# Patient Record
Sex: Female | Born: 1960 | Race: White | Hispanic: No | Smoking: Never smoker
Health system: Southern US, Community
[De-identification: ages and names within clinical notes are randomized; demographics above are authoritative.]

## PROBLEM LIST (undated history)

## (undated) DIAGNOSIS — Z789 Other specified health status: Secondary | ICD-10-CM

## (undated) DIAGNOSIS — I10 Essential (primary) hypertension: Secondary | ICD-10-CM

---

## 2020-04-07 ENCOUNTER — Emergency Department (HOSPITAL_COMMUNITY): Payer: 59

## 2020-04-07 ENCOUNTER — Encounter (HOSPITAL_COMMUNITY): Payer: Self-pay

## 2020-04-07 ENCOUNTER — Emergency Department (HOSPITAL_COMMUNITY)
Admission: EM | Admit: 2020-04-07 | Discharge: 2020-04-08 | Disposition: A | Discharge status: Home / Self Care | Payer: 59 | Attending: Emergency Medicine | Admitting: Emergency Medicine

## 2020-04-07 DIAGNOSIS — Z20822 Contact with and (suspected) exposure to covid-19: Secondary | ICD-10-CM | POA: Insufficient documentation

## 2020-04-07 DIAGNOSIS — I10 Essential (primary) hypertension: Secondary | ICD-10-CM | POA: Diagnosis not present

## 2020-04-07 DIAGNOSIS — R070 Pain in throat: Secondary | ICD-10-CM | POA: Insufficient documentation

## 2020-04-07 DIAGNOSIS — R0981 Nasal congestion: Secondary | ICD-10-CM | POA: Insufficient documentation

## 2020-04-07 DIAGNOSIS — R079 Chest pain, unspecified: Secondary | ICD-10-CM

## 2020-04-07 DIAGNOSIS — R519 Headache, unspecified: Secondary | ICD-10-CM | POA: Diagnosis present

## 2020-04-07 HISTORY — DX: Other specified health status: Z78.9

## 2020-04-07 LAB — CBC WITH DIFFERENTIAL/PLATELET
Abs Immature Granulocytes: 0.02 10*3/uL (ref 0.00–0.07)
Basophils Absolute: 0.1 10*3/uL (ref 0.0–0.1)
Basophils Relative: 1 %
Eosinophils Absolute: 0.1 10*3/uL (ref 0.0–0.5)
Eosinophils Relative: 1 %
HCT: 43.6 % (ref 36.0–46.0)
Hemoglobin: 14 g/dL (ref 12.0–15.0)
Immature Granulocytes: 0 %
Lymphocytes Relative: 26 %
Lymphs Abs: 2.6 10*3/uL (ref 0.7–4.0)
MCH: 28 pg (ref 26.0–34.0)
MCHC: 32.1 g/dL (ref 30.0–36.0)
MCV: 87.2 fL (ref 80.0–100.0)
Monocytes Absolute: 1.1 10*3/uL — ABNORMAL HIGH (ref 0.1–1.0)
Monocytes Relative: 11 %
Neutro Abs: 6.2 10*3/uL (ref 1.7–7.7)
Neutrophils Relative %: 61 %
Platelets: 297 10*3/uL (ref 150–400)
RBC: 5 MIL/uL (ref 3.87–5.11)
RDW: 12.4 % (ref 11.5–15.5)
WBC: 10.1 10*3/uL (ref 4.0–10.5)
nRBC: 0 % (ref 0.0–0.2)

## 2020-04-07 LAB — COMPREHENSIVE METABOLIC PANEL
ALT: 22 U/L (ref 0–44)
AST: 25 U/L (ref 15–41)
Albumin: 3.9 g/dL (ref 3.5–5.0)
Alkaline Phosphatase: 55 U/L (ref 38–126)
Anion gap: 12 (ref 5–15)
BUN: 16 mg/dL (ref 6–20)
CO2: 18 mmol/L — ABNORMAL LOW (ref 22–32)
Calcium: 9.1 mg/dL (ref 8.9–10.3)
Chloride: 109 mmol/L (ref 98–111)
Creatinine, Ser: 0.82 mg/dL (ref 0.44–1.00)
GFR, Estimated: >60 mL/min (ref >60)
Glucose, Bld: 110 mg/dL — ABNORMAL HIGH (ref 70–99)
Potassium: 3.9 mmol/L (ref 3.5–5.1)
Sodium: 139 mmol/L (ref 135–145)
Total Bilirubin: 1 mg/dL (ref 0.3–1.2)
Total Protein: 7.2 g/dL (ref 6.5–8.1)

## 2020-04-07 LAB — URINALYSIS, ROUTINE W REFLEX MICROSCOPIC
Bacteria, UA: NONE SEEN
Bilirubin Urine: NEGATIVE
Glucose, UA: NEGATIVE mg/dL
Ketones, ur: NEGATIVE mg/dL
Nitrite: NEGATIVE
Protein, ur: NEGATIVE mg/dL
Specific Gravity, Urine: 1.011 (ref 1.005–1.030)
pH: 5 (ref 5.0–8.0)

## 2020-04-07 LAB — RESP PANEL BY RT-PCR (FLU A&B, COVID) ARPGX2
Influenza A by PCR: NEGATIVE
Influenza B by PCR: NEGATIVE
SARS Coronavirus 2 by RT PCR: NEGATIVE

## 2020-04-07 LAB — LIPASE, BLOOD: Lipase: 38 U/L (ref 11–51)

## 2020-04-07 LAB — TROPONIN I (HIGH SENSITIVITY): Troponin I (High Sensitivity): 2 ng/L (ref <18)

## 2020-04-07 NOTE — ED Triage Notes (Signed)
EMS arrival from home co headache x1 week and new onset starting 4 days ago chest discomfort 10/10. Per EMS pt reported palpations, blurred vision, and lightheadedness, BP 260/140 denies n/v. EMS administered 324mg  aspirin and 2 4mg  Nitro with relief 4/10.

## 2020-04-07 NOTE — ED Notes (Signed)
Patient transported to CT 

## 2020-04-07 NOTE — ED Provider Notes (Signed)
[facility] EMERGENCY DEPARTMENT Provider Note   CSN: [id number] Arrival date & time: 04/07/20  2018     History Chief Complaint  Patient presents with  . Headache  . Chest Pain    Jamie Hart is a 59 y.o. female who presents today for evaluation of headache and chest pain for 3 days.  She reports that 3 days ago she developed pain in the center left side of her chest.  It has not radiated or moved.  She reports she has felt presyncopal, notices mostly with position changes.  She denies any history of high blood pressure, however does not have a primary care doctor.  She was given 324 mg of aspirin and 2 doses of nitro with EMS with relief of her symptoms and improvement of her chest pain down from a 10 out of 10 to a 4 out of 10.    Chart review shows she was seen at [facility] urgent care on 03/26/2020, and at that point her blood pressure was 144/86.  She reports that she had a runny nose and sore throat when the symptoms started however that is now resolved.  She denies vomiting diarrhea.  No abdominal pain or back pain.  She reports she gets paresthesias in her bilateral hands when she sleeps however this is unchanged from her baseline.  She is not vaccinated against Covid.  She has no known Covid exposures.  HPI     Past Medical History:  Diagnosis Date  . Known health problems: none     There are no problems to display for this patient.   Past Surgical History:  Procedure Laterality Date  . CESAREAN SECTION       OB History   No obstetric history on file.     History reviewed. No pertinent family history.  Social History   Tobacco Use  . Smoking status: Never Smoker  . Smokeless tobacco: Never Used  Substance Use Topics  . Alcohol use: Yes    Comment: social   . Drug use: Never    Home Medications Prior to Admission medications   Not on File    Allergies    Penicillins  Review of Systems   Review of Systems  Constitutional:  Positive for chills. Negative for fatigue.  HENT: Negative for congestion.   Respiratory: Positive for cough.   Cardiovascular: Positive for chest pain. Negative for palpitations and leg swelling.  Gastrointestinal: Positive for abdominal pain. Negative for constipation, diarrhea and vomiting.  Genitourinary: Negative for dysuria.  Musculoskeletal: Negative for back pain and neck pain.  Skin: Negative for color change and rash.  Neurological: Positive for light-headedness and headaches. Negative for syncope, speech difficulty and weakness.  All other systems reviewed and are negative.   Physical Exam Updated Vital Signs BP (!) 146/78 (BP Location: Right Arm)   Pulse 90   Temp 98.8 F (37.1 C) (Oral)   Resp 18   Ht 5\' 1"  (1.549 m)   Wt 79.8 kg   SpO2 95%   BMI 33.25 kg/m   Physical Exam Vitals and nursing note reviewed.  Constitutional:      General: She is not in acute distress.    Appearance: She is well-developed. She is not diaphoretic.  HENT:     Head: Normocephalic and atraumatic.     Mouth/Throat:     Mouth: Mucous membranes are moist.  Eyes:     General: No scleral icterus.       Right eye: No  discharge.        Left eye: No discharge.     Conjunctiva/sclera: Conjunctivae normal.  Cardiovascular:     Rate and Rhythm: Normal rate and regular rhythm.     Heart sounds: Normal heart sounds. No murmur heard.   Pulmonary:     Effort: Pulmonary effort is normal. No respiratory distress.     Breath sounds: Normal breath sounds. No stridor.  Abdominal:     General: There is no distension.     Tenderness: There is no abdominal tenderness.  Musculoskeletal:        General: No deformity.     Cervical back: Normal range of motion.  Skin:    General: Skin is warm and dry.  Neurological:     Mental Status: She is alert.     GCS: GCS eye subscore is 4. GCS verbal subscore is 5. GCS motor subscore is 6.     Cranial Nerves: No cranial nerve deficit.     Motor: No  weakness or abnormal muscle tone.  Psychiatric:        Mood and Affect: Mood normal.        Behavior: Behavior normal.     ED Results / Procedures / Treatments   Labs (all labs ordered are listed, but only abnormal results are displayed) Labs Reviewed  CBC WITH DIFFERENTIAL/PLATELET - Abnormal; Notable for the following components:      Result Value   Monocytes Absolute 1.1 (*)    All other components within normal limits  COMPREHENSIVE METABOLIC PANEL - Abnormal; Notable for the following components:   CO2 18 (*)    Glucose, Bld 110 (*)    All other components within normal limits  URINALYSIS, ROUTINE W REFLEX MICROSCOPIC - Abnormal; Notable for the following components:   Hgb urine dipstick MODERATE (*)    Leukocytes,Ua MODERATE (*)    All other components within normal limits  RESP PANEL BY RT-PCR (FLU A&B, COVID) ARPGX2  LIPASE, BLOOD  TROPONIN I (HIGH SENSITIVITY)  TROPONIN I (HIGH SENSITIVITY)    EKG EKG Interpretation  Date/Time:  Tuesday April 07 2020 21:51:37 EST Ventricular Rate:  83 PR Interval:  134 QRS Duration: 82 QT Interval:  358 QTC Calculation: 420 R Axis:   61 Text Interpretation: Normal sinus rhythm Normal ECG No previous ECGs available Confirmed by Richardean Canal [phone]) on 04/07/2020 10:23:15 PM   Radiology DG Chest 2 View  Result Date: 04/07/2020 CLINICAL DATA:  Chest pain EXAM: CHEST - 2 VIEW COMPARISON:  None. FINDINGS: The heart size and mediastinal contours are within normal limits. Both lungs are clear. The visualized skeletal structures are unremarkable. IMPRESSION: No active cardiopulmonary disease. Electronically Signed   By: Katherine Mantle M.D.   On: 04/07/2020 21:07   CT Head Wo Contrast  Result Date: 04/07/2020 CLINICAL DATA:  59 year old female with headache, hypertension. EXAM: CT HEAD WITHOUT CONTRAST TECHNIQUE: Contiguous axial images were obtained from the base of the skull through the vertex without intravenous contrast.  COMPARISON:  None. FINDINGS: Brain: No midline shift, ventriculomegaly, mass effect, evidence of mass lesion, intracranial hemorrhage or evidence of cortically based acute infarction. Gray-white matter differentiation is within normal limits throughout the brain. Vascular: No suspicious intracranial vascular hyperdensity. Skull: Negative. Sinuses/Orbits: Visualized paranasal sinuses and mastoids are clear. Other: Visualized orbits and scalp soft tissues are within normal limits. IMPRESSION: Normal noncontrast Head CT. Electronically Signed   By: Odessa Fleming M.D.   On: 04/07/2020 21:53    Procedures Procedures (  including critical care time)  Medications Ordered in ED Medications - No data to display  ED Course  I have reviewed the triage vital signs and the nursing notes.  Pertinent labs & imaging results that were available during my care of the patient were reviewed by me and considered in my medical decision making (see chart for details).    MDM Rules/Calculators/A&P                         Patient is a 59 year old woman who presents today for evaluation of chest pain and headache.  She started feeling lightheaded.  With EMS her blood pressure was reportedly in the 260s systolic, here her blood pressure was 151/74 on arrival.  Covid test is negative.  Given her headache head CT is obtained without acute abnormalities.  EMS did give her 2 doses of nitroglycerin which improved her symptoms and may have contributed to her lower blood pressure.  CBC does not show significant anemia.  UA shows moderate blood and leukocytes however no bacteria seen.  She is neurovascularly intact on my exam.  Plan to continue monitor blood pressure and obtain 2 troponins.  If these are negative anticipate patient will most likely be able to discharge home.  At shift change care was transferred to Sharilyn Sites PA-C who will follow pending studies, re-evaulate and determine disposition.    Note: Portions of this  report may have been transcribed using voice recognition software. Every effort was made to ensure accuracy; however, inadvertent computerized transcription errors may be present  Final Clinical Impression(s) / ED Diagnoses Final diagnoses:  Hypertension, unspecified type  Chest pain, unspecified type  Acute nonintractable headache, unspecified headache type    Rx / DC Orders ED Discharge Orders    None       Norman Clay 04/07/20 [id number]    Charlynne Pander, MD 04/08/20 1556

## 2020-04-07 NOTE — ED Notes (Addendum)
Patient is resting comfortably. Pt is back from CT.

## 2020-04-07 NOTE — Discharge Instructions (Addendum)
Work-up today was all reassuring. Follow-up with your doctor about your blood pressure.

## 2020-04-08 LAB — TROPONIN I (HIGH SENSITIVITY): Troponin I (High Sensitivity): 6 ng/L (ref <18)

## 2020-04-08 NOTE — ED Provider Notes (Signed)
   Delta trop is negative.  BP remains largely unchanged from time of arrival, 151/83 on last check.  Stable for discharge.  Has had some recent stress due to abrupt death in family so elevated BP may be transient, but will need close FU with PCP. May return here for any new/acute changes.   Garlon Hatchet, PA-C 04/08/20 [id number]    Gilda Crease, MD 04/08/20 [phone]

## 2021-10-03 IMAGING — CT CT HEAD W/O CM
3 series · 16 of 47 positions shown, 19 images · non-contrast
Comparison: None.

CLINICAL DATA: 59-year-old female with headache, hypertension.

EXAM:
CT HEAD WITHOUT CONTRAST
TECHNIQUE: Contiguous axial images were obtained from the base of the skull
through the vertex without intravenous contrast.

[Series 3: head 5.0 h30s · axial · 0.41mm/px · z∈[-140,+0]mm · 10 of 34 slices shown, 13 images]
[im 3/34  brain]
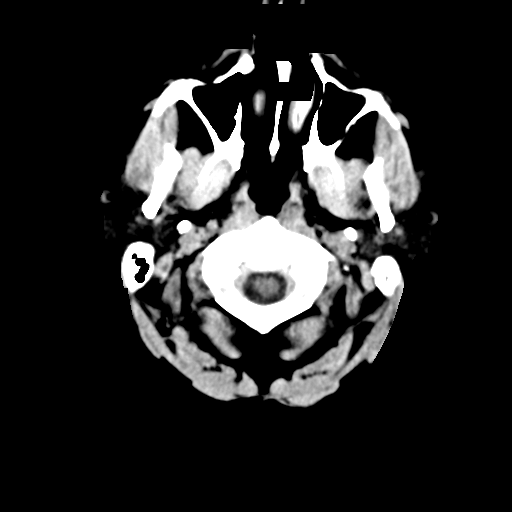
[im 3/34  bone]
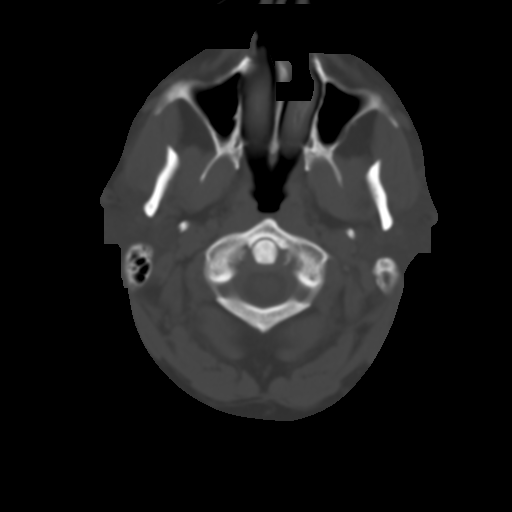
[im 6/34  brain]
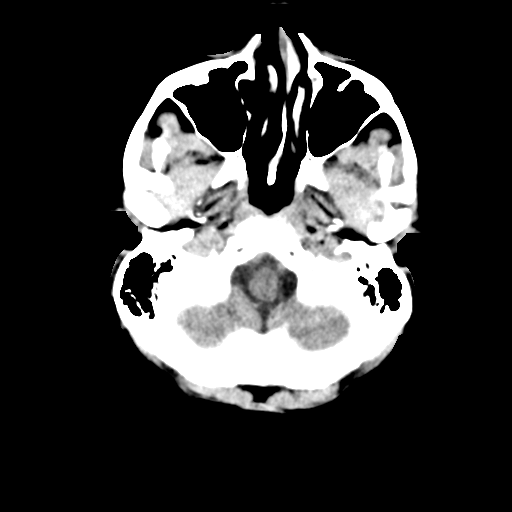
[im 10/34  brain]
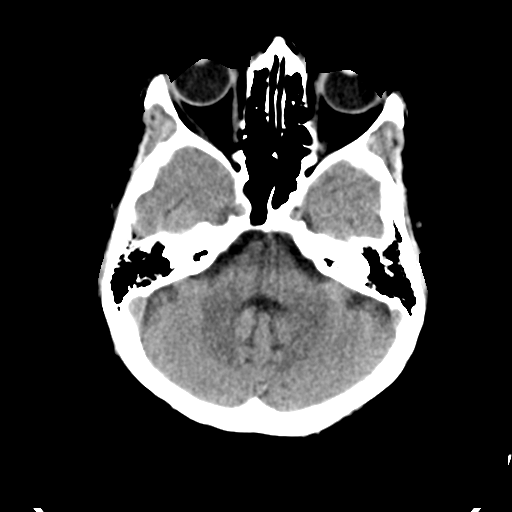
[im 12/34  brain]
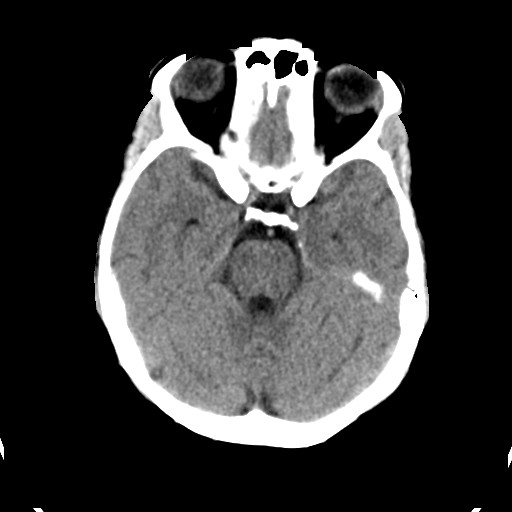
[im 15/34  brain]
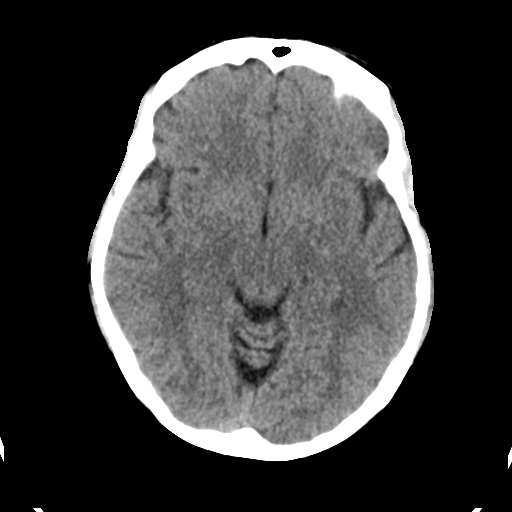
[im 15/34  bone]
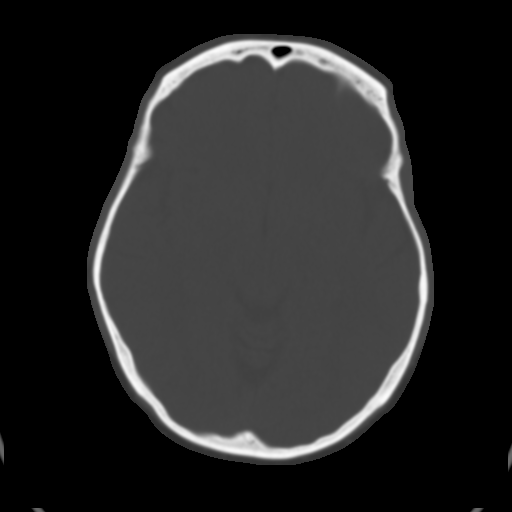
[im 19/34  brain]
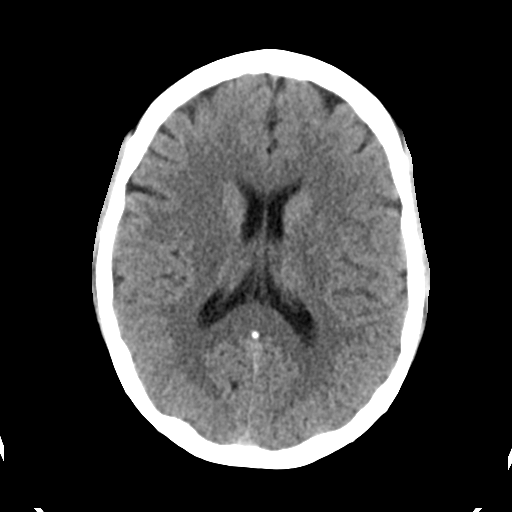
[im 22/34  brain]
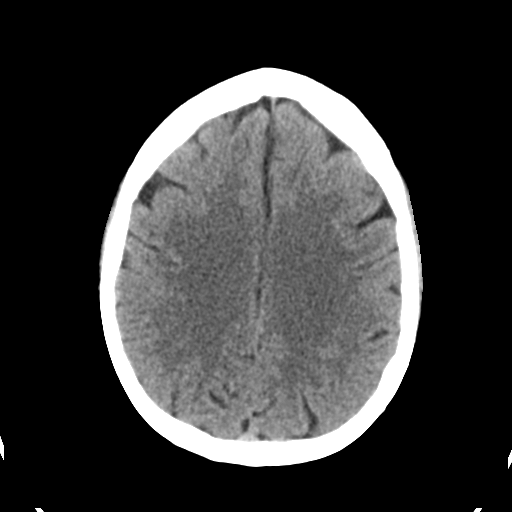
[im 26/34  brain]
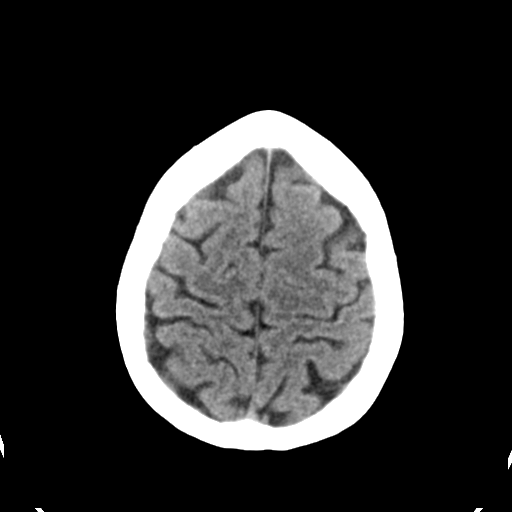
[im 28/34  brain]
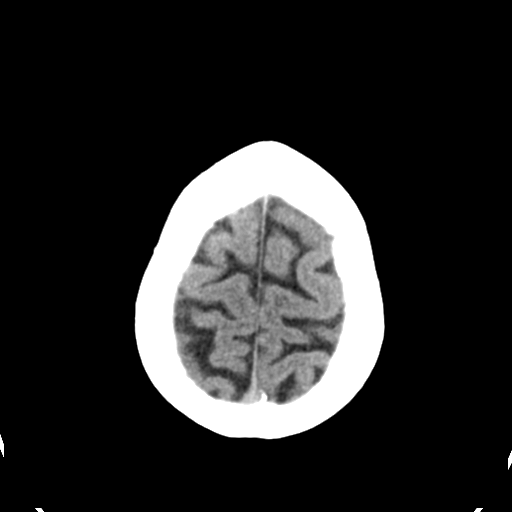
[im 28/34  bone]
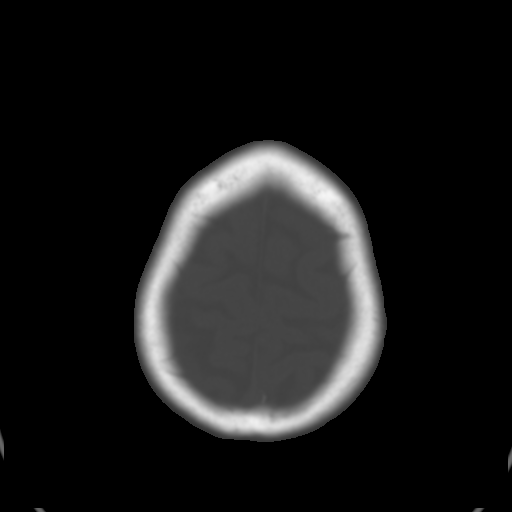
[im 31/34  brain]
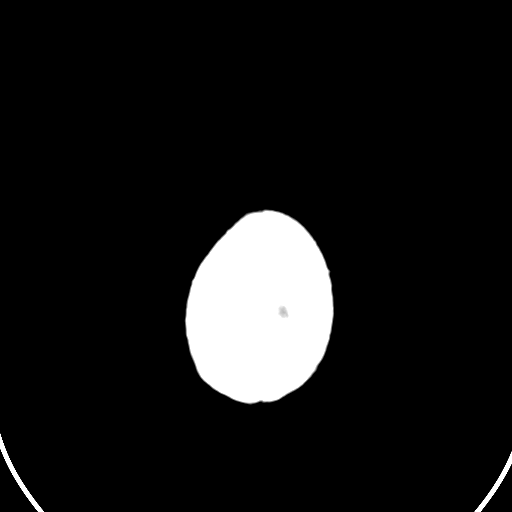

[Series 5: head 3.0 mpr cor · coronal · 0.33mm/px · 3 of 67 slices shown]
[im 23/67  brain]
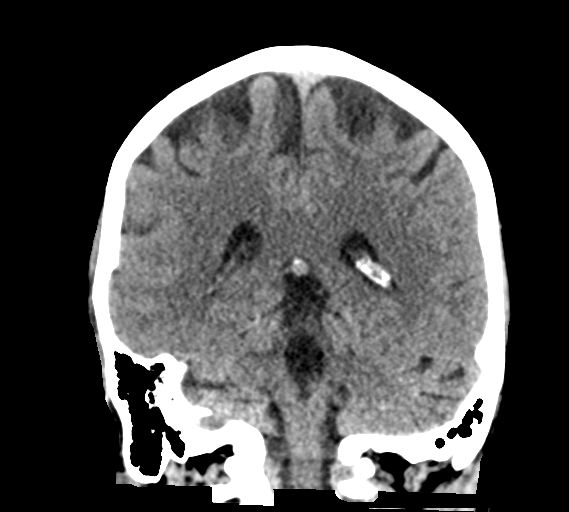
[im 30/67  brain]
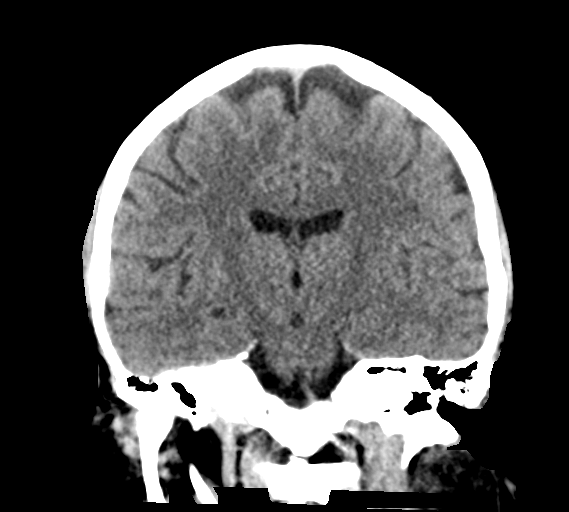
[im 37/67  brain]
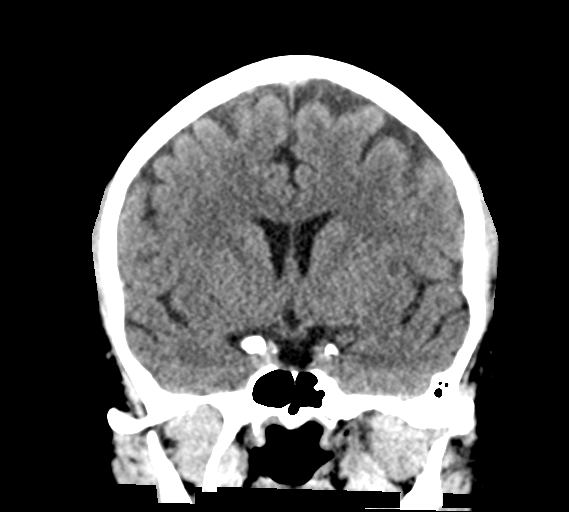

[Series 6: head 3.0 mpr sag · sagittal · 0.33mm/px · 3 of 65 slices shown]
[im 22/65  brain]
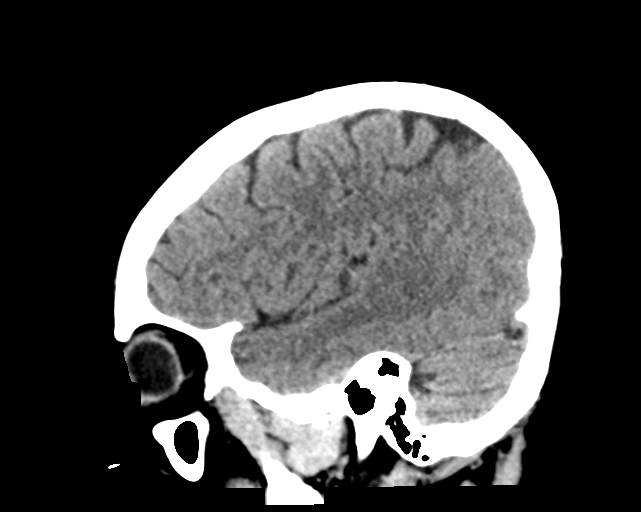
[im 33/65  brain]
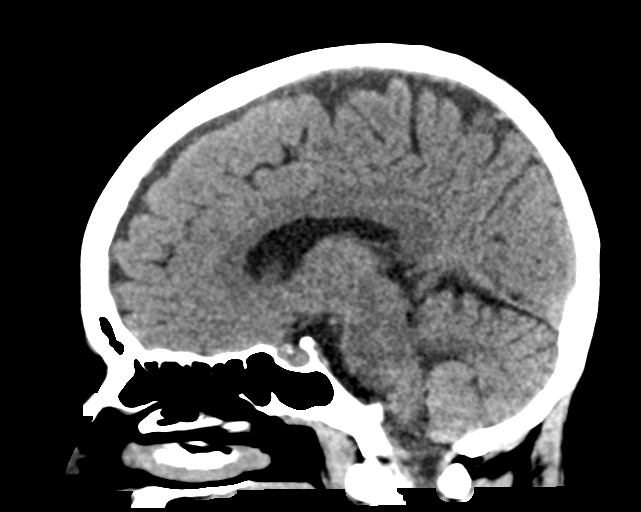
[im 43/65  brain]
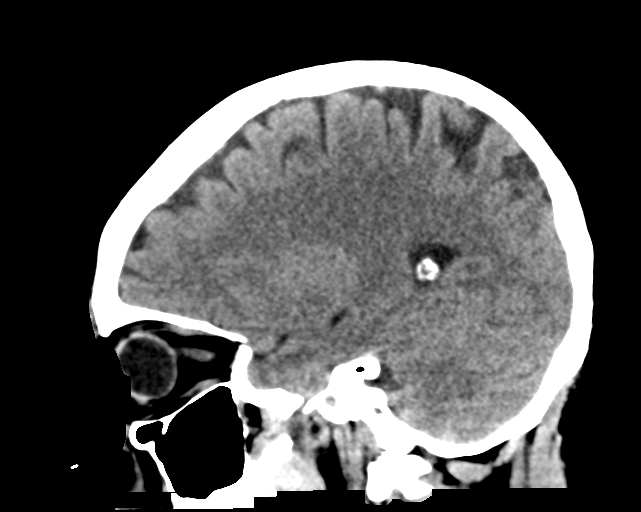

[16 of 47 positions shown; findings below may reference images not displayed]

FINDINGS: Brain: No midline shift, ventriculomegaly, mass effect, evidence of
mass lesion, intracranial hemorrhage or evidence of cortically based
acute infarction. Gray-white matter differentiation is within normal
limits throughout the brain.

Vascular: No suspicious intracranial vascular hyperdensity.

Skull: Negative.

Sinuses/Orbits: Visualized paranasal sinuses and mastoids are clear.

Other: Visualized orbits and scalp soft tissues are within normal
limits.
IMPRESSION: Normal noncontrast Head CT.

## 2023-06-14 ENCOUNTER — Emergency Department (HOSPITAL_COMMUNITY): Payer: Self-pay

## 2023-06-14 ENCOUNTER — Emergency Department (HOSPITAL_COMMUNITY)
Admission: EM | Admit: 2023-06-14 | Discharge: 2023-06-14 | Disposition: A | Discharge status: Home / Self Care | Payer: Self-pay | Attending: Emergency Medicine | Admitting: Emergency Medicine

## 2023-06-14 ENCOUNTER — Other Ambulatory Visit: Payer: Self-pay

## 2023-06-14 ENCOUNTER — Encounter (HOSPITAL_COMMUNITY): Payer: Self-pay | Admitting: *Deleted

## 2023-06-14 DIAGNOSIS — E871 Hypo-osmolality and hyponatremia: Secondary | ICD-10-CM | POA: Diagnosis not present

## 2023-06-14 DIAGNOSIS — R4182 Altered mental status, unspecified: Secondary | ICD-10-CM | POA: Diagnosis present

## 2023-06-14 DIAGNOSIS — R7309 Other abnormal glucose: Secondary | ICD-10-CM | POA: Insufficient documentation

## 2023-06-14 DIAGNOSIS — I10 Essential (primary) hypertension: Secondary | ICD-10-CM | POA: Insufficient documentation

## 2023-06-14 DIAGNOSIS — R17 Unspecified jaundice: Secondary | ICD-10-CM

## 2023-06-14 DIAGNOSIS — R739 Hyperglycemia, unspecified: Secondary | ICD-10-CM

## 2023-06-14 HISTORY — DX: Essential (primary) hypertension: I10

## 2023-06-14 LAB — URINALYSIS, ROUTINE W REFLEX MICROSCOPIC
Bilirubin Urine: NEGATIVE
Glucose, UA: NEGATIVE mg/dL
Ketones, ur: NEGATIVE mg/dL
Nitrite: NEGATIVE
Protein, ur: NEGATIVE mg/dL
Specific Gravity, Urine: 1.008 (ref 1.005–1.030)
pH: 5 (ref 5.0–8.0)

## 2023-06-14 LAB — CBC WITH DIFFERENTIAL/PLATELET
Abs Immature Granulocytes: 0.02 10*3/uL (ref 0.00–0.07)
Basophils Absolute: 0.1 10*3/uL (ref 0.0–0.1)
Basophils Relative: 1 %
Eosinophils Absolute: 0.2 10*3/uL (ref 0.0–0.5)
Eosinophils Relative: 2 %
HCT: 41.1 % (ref 36.0–46.0)
Hemoglobin: 13.6 g/dL (ref 12.0–15.0)
Immature Granulocytes: 0 %
Lymphocytes Relative: 24 %
Lymphs Abs: 2.6 10*3/uL (ref 0.7–4.0)
MCH: 28.2 pg (ref 26.0–34.0)
MCHC: 33.1 g/dL (ref 30.0–36.0)
MCV: 85.1 fL (ref 80.0–100.0)
Monocytes Absolute: 1.2 10*3/uL — ABNORMAL HIGH (ref 0.1–1.0)
Monocytes Relative: 11 %
Neutro Abs: 6.9 10*3/uL (ref 1.7–7.7)
Neutrophils Relative %: 62 %
Platelets: 311 10*3/uL (ref 150–400)
RBC: 4.83 MIL/uL (ref 3.87–5.11)
RDW: 13.2 % (ref 11.5–15.5)
WBC: 10.9 10*3/uL — ABNORMAL HIGH (ref 4.0–10.5)
nRBC: 0 % (ref 0.0–0.2)

## 2023-06-14 LAB — COMPREHENSIVE METABOLIC PANEL
ALT: 28 U/L (ref 0–44)
AST: 22 U/L (ref 15–41)
Albumin: 4.1 g/dL (ref 3.5–5.0)
Alkaline Phosphatase: 57 U/L (ref 38–126)
Anion gap: 11 (ref 5–15)
BUN: 21 mg/dL (ref 8–23)
CO2: 21 mmol/L — ABNORMAL LOW (ref 22–32)
Calcium: 9 mg/dL (ref 8.9–10.3)
Chloride: 100 mmol/L (ref 98–111)
Creatinine, Ser: 0.95 mg/dL (ref 0.44–1.00)
GFR, Estimated: >60 mL/min (ref >60)
Glucose, Bld: 129 mg/dL — ABNORMAL HIGH (ref 70–99)
Potassium: 3.6 mmol/L (ref 3.5–5.1)
Sodium: 132 mmol/L — ABNORMAL LOW (ref 135–145)
Total Bilirubin: 1.3 mg/dL — ABNORMAL HIGH (ref 0.0–1.2)
Total Protein: 7.8 g/dL (ref 6.5–8.1)

## 2023-06-14 LAB — CBG MONITORING, ED: Glucose-Capillary: 120 mg/dL — ABNORMAL HIGH (ref 70–99)

## 2023-06-14 LAB — AMMONIA: Ammonia: 17 umol/L (ref 9–35)

## 2023-06-14 LAB — ETHANOL: Alcohol, Ethyl (B): <10 mg/dL (ref <10)

## 2023-06-14 MED ORDER — LORAZEPAM 2 MG/ML IJ SOLN
1.0000 mg | Freq: Once | INTRAMUSCULAR | Status: DC
  Filled 2023-06-14: qty 1

## 2023-06-14 MED ORDER — LORAZEPAM 1 MG PO TABS: 1.0000 mg | ORAL_TABLET | Freq: Once | ORAL | Status: DC

## 2023-06-14 MED ORDER — LORAZEPAM 2 MG/ML IJ SOLN
1.0000 mg | Freq: Once | INTRAMUSCULAR | Status: DC
Start: 2023-06-14 — End: 2023-06-14

## 2023-06-14 NOTE — ED Notes (Signed)
Pt refused MRI MD Preston Fleeting made aware

## 2023-06-14 NOTE — ED Triage Notes (Signed)
Pt arrives via [facility] from home. Per report, from a hotel room with her boyfriend who reported she "Went into a daze moment, walking around confused, did not know who he was or where she was" She remembers feeling confused, but does not remember what was happening. Last about 10-15 minutes. Hx of HTN. Initial BP 206/106, last 150/100. Takes BP meds, thinks she may have forgot today. Headache and nausea through the day today. Stroke screen negative, c,a, o x 4 en route. HR 86, 98% RR 18, cbg 129

## 2023-06-14 NOTE — Discharge Instructions (Addendum)
Your evaluation in the emergency department did not show a cause for your episode of confusion.  However, you have chosen not to have an MRI scan which may show a small stroke or other abnormality which is not necessarily seen on CT scan.  If you have recurrent symptoms, please return for reevaluation.

## 2023-06-14 NOTE — ED Provider Notes (Signed)
[name] EMERGENCY DEPARTMENT AT [facility] Provider Note   CSN: [id number] Arrival date & time: 06/14/23  1191     History  Chief Complaint  Patient presents with   Altered Mental Status    Jamie Hart is a 63 y.o. female.  The history is provided by the patient and the EMS personnel.  Altered Mental Status She has history of hypertension MS brought in by ambulance because of an episode of confusion.  Patient does remember that she was confused, but cannot recall any details.  Her boyfriend called the cause of this episode of confusion.  It lasted about 10 minutes before resolving.  Of note, patient states she may have forgotten to take her blood pressure medication today.  EMS noted initial blood pressure 206/106, but had come down to 150/100 without any treatment.  Denies headache, nausea, vomiting.  She denies ethanol and drug use.   Home Medications Prior to Admission medications   Not on File      Allergies    Penicillins    Review of Systems   Review of Systems  All other systems reviewed and are negative.   Physical Exam Updated Vital Signs BP (!) 148/106 (BP Location: Right Arm)   Pulse 91   Temp 98.3 F (36.8 C) (Oral)   Resp 16   Ht 5\' 1"  (1.549 m)   Wt 83.5 kg   SpO2 100%   BMI 34.77 kg/m  Physical Exam Vitals and nursing note reviewed.   63 year old female, resting comfortably and in no acute distress. Vital signs are significant for elevated blood pressure. Oxygen saturation is 100%, which is normal. Head is normocephalic and atraumatic. PERRLA, EOMI. Oropharynx is clear. Neck is nontender and supple without adenopathy or JVD.  There are no carotid bruits. Back is nontender and there is no CVA tenderness. Lungs are clear without rales, wheezes, or rhonchi. Chest is nontender. Heart has regular rate and rhythm without murmur. Abdomen is soft, flat, nontender. Extremities have no cyanosis or edema, full range of motion is  present. Skin is warm and dry without rash. Neurologic: Mental status is normal, cranial nerves are intact, strength is 5/5, coordination is normal.  ED Results / Procedures / Treatments   Labs (all labs ordered are listed, but only abnormal results are displayed) Labs Reviewed  COMPREHENSIVE METABOLIC PANEL - Abnormal; Notable for the following components:      Result Value   Sodium 132 (*)    CO2 21 (*)    Glucose, Bld 129 (*)    Total Bilirubin 1.3 (*)    All other components within normal limits  CBC WITH DIFFERENTIAL/PLATELET - Abnormal; Notable for the following components:   WBC 10.9 (*)    Monocytes Absolute 1.2 (*)    All other components within normal limits  URINALYSIS, ROUTINE W REFLEX MICROSCOPIC - Abnormal; Notable for the following components:   Color, Urine STRAW (*)    Hgb urine dipstick MODERATE (*)    Leukocytes,Ua SMALL (*)    Bacteria, UA RARE (*)    All other components within normal limits  CBG MONITORING, ED - Abnormal; Notable for the following components:   Glucose-Capillary 120 (*)    All other components within normal limits  ETHANOL  AMMONIA    EKG EKG Interpretation Date/Time:  Wednesday June 14 2023 02:45:38 EST Ventricular Rate:  91 PR Interval:  131 QRS Duration:  84 QT Interval:  361 QTC Calculation: 445 R Axis:  70  Text Interpretation: Sinus rhythm LAE, consider biatrial enlargement When compared with ECG of 04/07/2020, No significant change was found Confirmed by Jamie Hart ([id number]) on 06/14/2023 2:48:33 AM  Radiology CT Head Wo Contrast Result Date: 06/14/2023 CLINICAL DATA:  Mental status change, unknown cause EXAM: CT HEAD WITHOUT CONTRAST TECHNIQUE: Contiguous axial images were obtained from the base of the skull through the vertex without intravenous contrast. RADIATION DOSE REDUCTION: This exam was performed according to the departmental dose-optimization program which includes automated exposure control, adjustment of the  mA and/or kV according to patient size and/or use of iterative reconstruction technique. COMPARISON:  CT head 04/07/2020. FINDINGS: Brain: No evidence of acute infarction, hemorrhage, hydrocephalus, extra-axial collection or mass lesion/mass effect. Vascular: No hyperdense vessel. Skull: No acute fracture. Sinuses/Orbits: Clear sinuses.  No acute orbital findings. Other: No mastoid effusions. IMPRESSION: No evidence of acute intracranial abnormality. Electronically Signed   By: Jamie Hart M.D.   On: 06/14/2023 04:04    Procedures Procedures  Cardiac monitor shows normal sinus rhythm, per my interpretation.  Medications Ordered in ED Medications - No data to display  ED Course/ Medical Decision Making/ A&P                                 Medical Decision Making Amount and/or Complexity of Data Reviewed Labs: ordered. Radiology: ordered.  Transient episode of confusion which has resolved.  Elevated blood pressure which is least partly secondary to medication noncompliance.  Exam is nonfocal.  Without other neurologic findings, doubt transient ischemic attack.  Possible temporal lobe seizure.  I have ordered ED workup of CBC, comprehensive metabolic panel, ethanol level, ammonia level, urinalysis, CT of head.  I have reviewed her electrocardiogram, and my interpretation is biatrial enlargement but no acute ST or T changes, unchanged from prior.  I have reviewed her prior records, and review of medications indicates that nothing that is likely to be psychoactive.  I reviewed her laboratory tests, my interpretation is elevated random glucose level which will need to be followed as an outpatient, borderline elevated total bilirubin which is not felt to be clinically significant, mild hyponatremia which is also not felt to be clinically significant, undetectable ethanol, mild leukocytosis which is nonspecific, normal urinalysis, normal ammonia level.  CT of head shows no evidence of acute  intracranial abnormality.  I have independently viewed the images, and agree with the radiologist's interpretation.  I ordered an MRI to look for evidence of stroke, but patient is refusing MRI because of her claustrophobia.  I told her that I would be medicating her with lorazepam, she still states that she will not do MRI.  She does understand that there may be a stroke which is not visible on CT scan that I am not able to diagnose.  I am discharging her with instructions to return if she has recurrent symptoms.  Final Clinical Impression(s) / ED Diagnoses Final diagnoses:  Altered mental status, unspecified altered mental status type  Hyponatremia  Elevated random blood glucose level  Serum total bilirubin elevated    Rx / DC Orders ED Discharge Orders     None         Jamie Booze, MD 06/14/23 [phone]
